# Patient Record
Sex: Male | Born: 1995 | Race: Black or African American | Hispanic: No | Marital: Single | Smoking: Never smoker
Health system: Southern US, Community
[De-identification: ages and names within clinical notes are randomized; demographics above are authoritative.]

## PROBLEM LIST (undated history)

## (undated) DIAGNOSIS — S43006A Unspecified dislocation of unspecified shoulder joint, initial encounter: Secondary | ICD-10-CM

---
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
## 2019-09-14 ENCOUNTER — Other Ambulatory Visit: Payer: Self-pay

## 2019-09-14 ENCOUNTER — Emergency Department (HOSPITAL_COMMUNITY)
Admission: EM | Admit: 2019-09-14 | Discharge: 2019-09-14 | Disposition: A | Discharge status: Home / Self Care | Payer: Self-pay | Attending: Emergency Medicine | Admitting: Emergency Medicine

## 2019-09-14 ENCOUNTER — Encounter (HOSPITAL_COMMUNITY): Payer: Self-pay

## 2019-09-14 ENCOUNTER — Emergency Department (HOSPITAL_COMMUNITY): Payer: Medicaid Other

## 2019-09-14 DIAGNOSIS — M25532 Pain in left wrist: Secondary | ICD-10-CM | POA: Insufficient documentation

## 2019-09-14 DIAGNOSIS — Z79899 Other long term (current) drug therapy: Secondary | ICD-10-CM | POA: Insufficient documentation

## 2019-09-14 MED ORDER — NAPROXEN 500 MG PO TABS: 500.0000 mg | ORAL_TABLET | Freq: Two times a day (BID) | ORAL | 0 refills | Status: AC | PRN

## 2019-09-14 NOTE — ED Provider Notes (Signed)
[facility]-EMERGENCY DEPT Provider Note   CSN: [id number] Arrival date & time: 09/14/19  1154     History   Chief Complaint Chief Complaint  Patient presents with  . Wrist Pain    HPI Jason Fry is a 23 y.o. right hand dominant male presents to emergency department today with chief complaint of left wrist pain x2 weeks.  Patient states he was playing baseball and slid into a base when he injured his left wrist.  He endorses pain on the lateral aspect of left wrist. He describes pain as aching.  Pain does not radiate.  Patient states pain is worse with movement and when doing a push-up. He rates pain 5 out of 10 in severity.  He has been taking ibuprofen with transient symptom relief.  He denies fever, chills, numbness, weakness, wound.  Past Medical History:  Diagnosis Date  . Shoulder separation    Right    There are no active problems to display for this patient.   History reviewed. No pertinent surgical history.      Home Medications    Prior to Admission medications   Medication Sig Start Date End Date Taking? Authorizing Provider  albuterol (PROVENTIL HFA;VENTOLIN HFA) 108 (90 BASE) MCG/ACT inhaler Inhale 2 puffs into the lungs every 6 (six) hours as needed for wheezing or shortness of breath.    [provider]  amphetamine-dextroamphetamine (ADDERALL) 20 MG tablet Take 20 mg by mouth every morning.    [provider]  cetirizine (ZYRTEC) 10 MG tablet Take 10 mg by mouth at bedtime.    [provider]  Menthol, Topical Analgesic, (BIOFREEZE EX) Apply 1 application topically daily.    [provider]  naproxen (NAPROSYN) 500 MG tablet Take 1 tablet (500 mg total) by mouth 2 (two) times daily as needed for up to 14 days. 09/14/19 09/28/19  Destinie Thornsberry, Caroleen Hamman, PA-C    Family History History reviewed. No pertinent family history.  Social History Social History   Tobacco Use  . Smoking status: Never  Smoker  Substance Use Topics  . Alcohol use: Not on file  . Drug use: Not on file     Allergies   Patient has no known allergies.   Review of Systems Review of Systems  Constitutional: Negative for chills and fever.  Musculoskeletal: Positive for arthralgias. Negative for joint swelling.  Skin: Negative for rash and wound.  Allergic/Immunologic: Negative for immunocompromised state.  Neurological: Negative for weakness and numbness.     Physical Exam Updated Vital Signs BP 135/85   Pulse (!) 50   Temp 98.1 F (36.7 C) (Oral)   Resp 16   SpO2 100%   Physical Exam Vitals signs and nursing note reviewed.  Constitutional:      Appearance: He is well-developed. He is not ill-appearing or toxic-appearing.  HENT:     Head: Normocephalic and atraumatic.     Nose: Nose normal.  Eyes:     General: No scleral icterus.       Right eye: No discharge.        Left eye: No discharge.     Conjunctiva/sclera: Conjunctivae normal.  Neck:     Musculoskeletal: Normal range of motion.     Vascular: No JVD.  Cardiovascular:     Rate and Rhythm: Normal rate and regular rhythm.     Pulses: Normal pulses.     Heart sounds: Normal heart sounds.  Pulmonary:     Effort: Pulmonary effort is  normal.     Breath sounds: Normal breath sounds.  Abdominal:     General: There is no distension.  Musculoskeletal: Normal range of motion.     Comments: Left wrist with mild tenderness to palpation of lateral aspect.  No swelling. There is no joint effusion noted. Full ROM without pain.  No erythema or warmth overlaying the joint. There is no anatomic snuff box tenderness. Normal sensation and motor function in the median, ulnar, and radial nerve distributions. 2+ radial pulse.  Strong and equal grip strength in bilateral upper extremities Full range of motion of left elbow and shoulder.    Skin:    General: Skin is warm and dry.  Neurological:     Mental Status: He is oriented to person, place,  and time.     GCS: GCS eye subscore is 4. GCS verbal subscore is 5. GCS motor subscore is 6.     Comments: Fluent speech, no facial droop.  Psychiatric:        Behavior: Behavior normal.      ED Treatments / Results  Labs (all labs ordered are listed, but only abnormal results are displayed) Labs Reviewed - No data to display  EKG None  Radiology Dg Wrist Complete Left  Result Date: 09/14/2019 CLINICAL DATA:  Fall on outstretched hand 2 weeks ago. Medial right wrist pain EXAM: LEFT WRIST - COMPLETE 3+ VIEW COMPARISON:  None. FINDINGS: There is no evidence of fracture or dislocation. There is no evidence of arthropathy or other focal bone abnormality. Soft tissues are unremarkable. IMPRESSION: Negative. Electronically Signed   By: Davina Poke M.D.   On: 09/14/2019 12:53    Procedures Procedures (including critical care time)  Medications Ordered in ED Medications - No data to display   Initial Impression / Assessment and Plan / ED Course  I have reviewed the triage vital signs and the nursing notes.  Pertinent labs & imaging results that were available during my care of the patient were reviewed by me and considered in my medical decision making (see chart for details).  Patient presents to the ED with complaints of pain to the left wrist x 2weeks s/p injury sliding into a base. Exam without obvious deformity, open wounds, or bruising, edema. ROM intact. Mild tenderness to palpation of lateral aspect of left wrist. Radial pulse is 2+ bilaterally. Strong and equal grip strength  NVI distally.  No anatomic snuffbox tenderness.  Xray viewed by me is negative for fracture/dislocation. Therapeutic splint provided. PRICE and naproxen recommended. I discussed results, treatment plan, need for follow-up, and return precautions with the patient. Provided opportunity for questions, patient confirmed understanding and are in agreement with plan.    Portions of this note were generated  with [employer]. Dictation errors may occur despite best attempts at proofreading.  Final Clinical Impressions(s) / ED Diagnoses   Final diagnoses:  Acute pain of left wrist    ED Discharge Orders         Ordered    naproxen (NAPROSYN) 500 MG tablet  2 times daily PRN     09/14/19 1307           Cherre Robins, PA-C 09/14/19 1335    Fredia Sorrow, MD 09/22/19 [phone]

## 2019-09-14 NOTE — ED Triage Notes (Addendum)
Pt states that he plays baseball and injured his left wrist sliding into the base. Pt states it hurts on the outside of his wrist. Pt states that the pushup position is painful. Pt is able to move fingers.  Pt states that this occurred 1.5-2 weeks ago

## 2019-09-14 NOTE — Discharge Instructions (Addendum)
Wrist injuries are frequent in adults and children. A sprain is an injury to the ligaments that hold your bones together. A strain is an injury to muscle or muscle tendons (cord like structure) from stretching or pulling.   Your xray is negative for any broken bones today.  -A prescription for naproxen was sent to your pharmacy.  This is an anti-inflammatory medicine.  Please take as prescribed.  You should take food when taking this medicine as it can cause upset stomach.  Do not take any other anti-inflammatory medicine such as ibuprofen, Aleve, Advil, Motrin, Goody powders while taking naproxen.  Wear the splint for comfort.  If you were given a splint, wear your splint for at least one week or until seen by a physician for a follow-up examination.  -I have given you information for Dr. Tamera Punt, a bone doctor with [facility].  He can call his office to schedule follow-up appointment if needed.  SEEK IMMEDIATE MEDICAL ATTENTION IF: Your fingers are swollen very red, white, and cold or blue.  Your fingers are numb or tingling.  You have increasing pain or difficulty moving your fingers. Remember the importance of follow-up and possible follow-up x-rays. Improvement in pain level is not 100% insurance of not having a fracture.

## 2019-09-14 NOTE — ED Notes (Signed)
ED Provider at bedside. 

## 2020-01-09 ENCOUNTER — Ambulatory Visit: Payer: Medicaid Other

## 2020-03-23 DEATH — deceased

## 2020-09-22 IMAGING — CR DG WRIST COMPLETE 3+V*L*
4 series · 4 of 4 positions shown · non-contrast
Comparison: None.

CLINICAL DATA: Fall on outstretched hand 2 weeks ago. Medial right
wrist pain

EXAM:
LEFT WRIST - COMPLETE 3+ VIEW

[x wrist pa left]
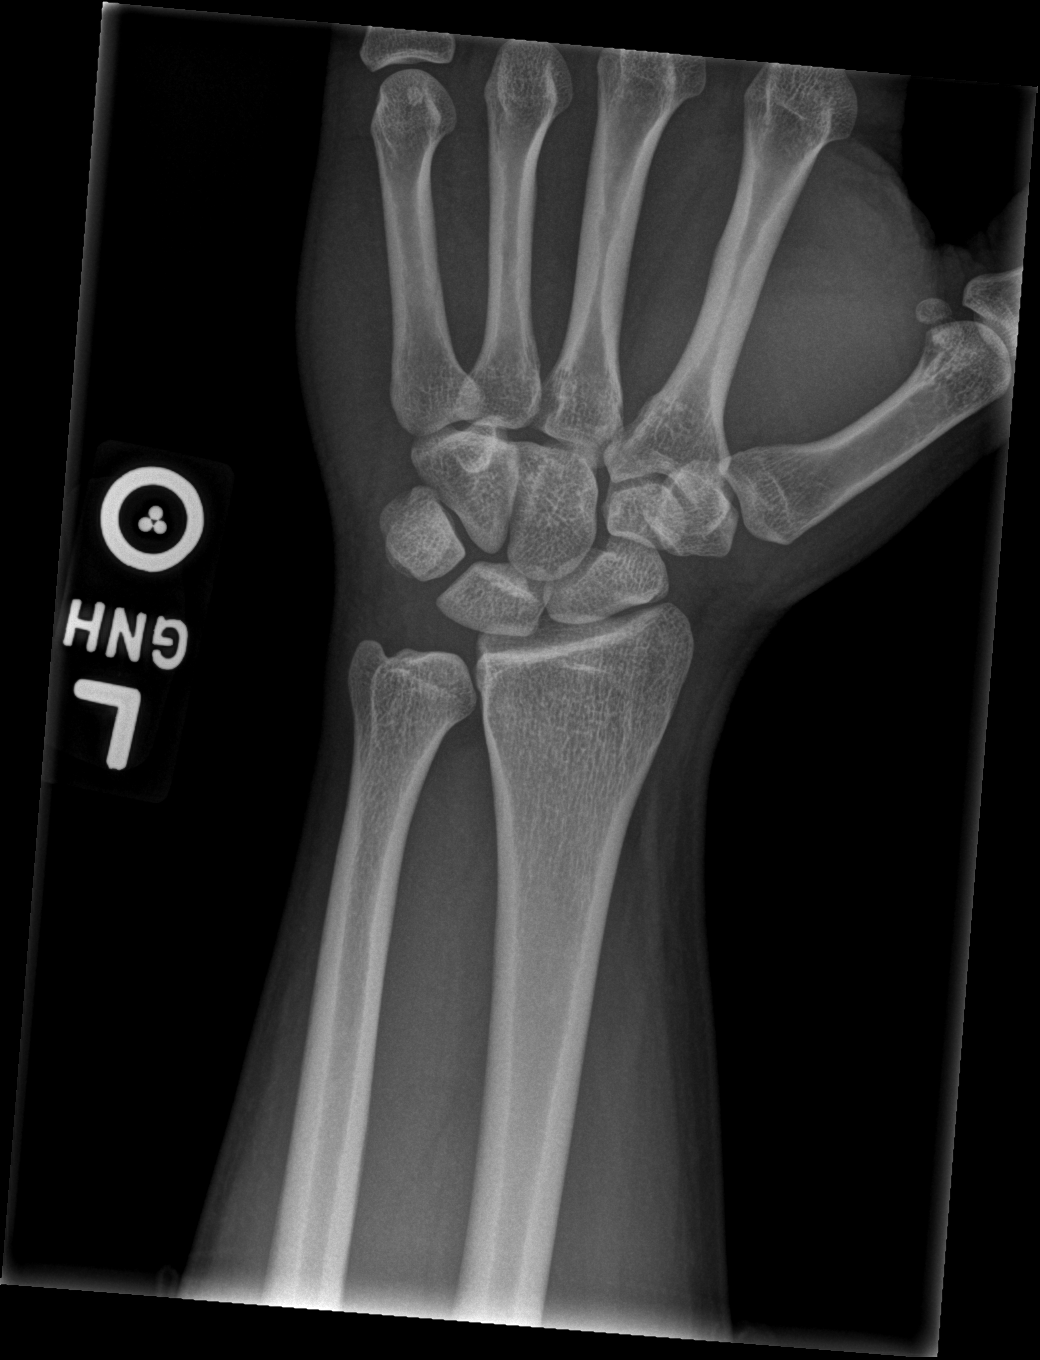

[x wrist obl left]
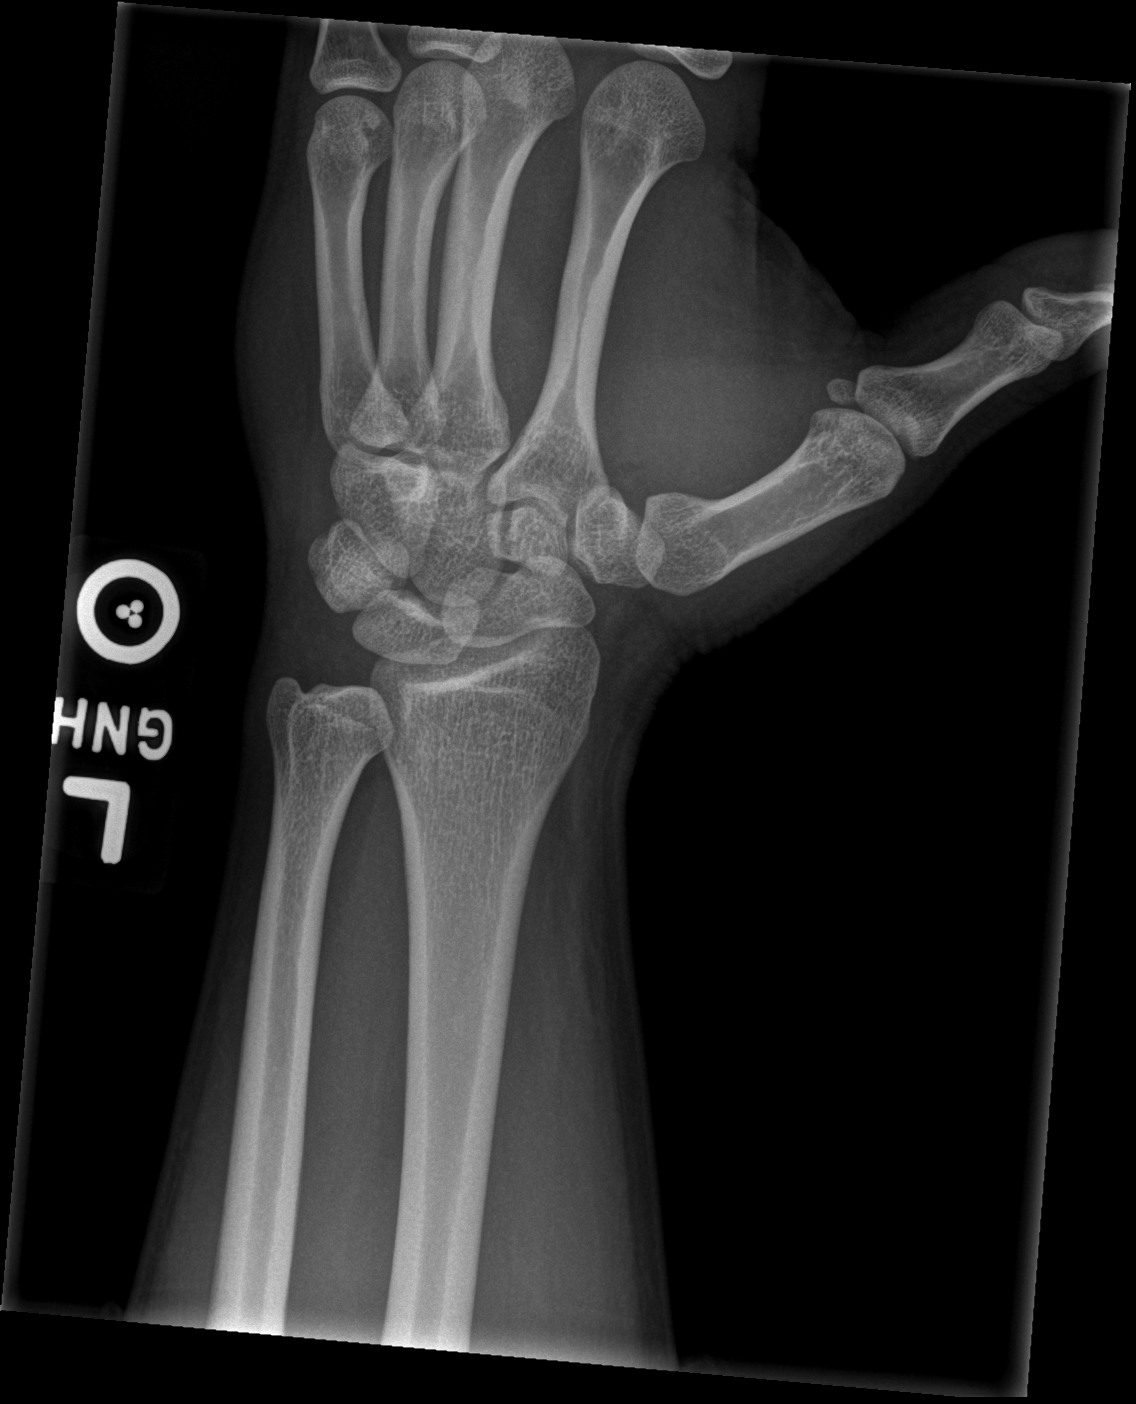

[x wrist lat left]
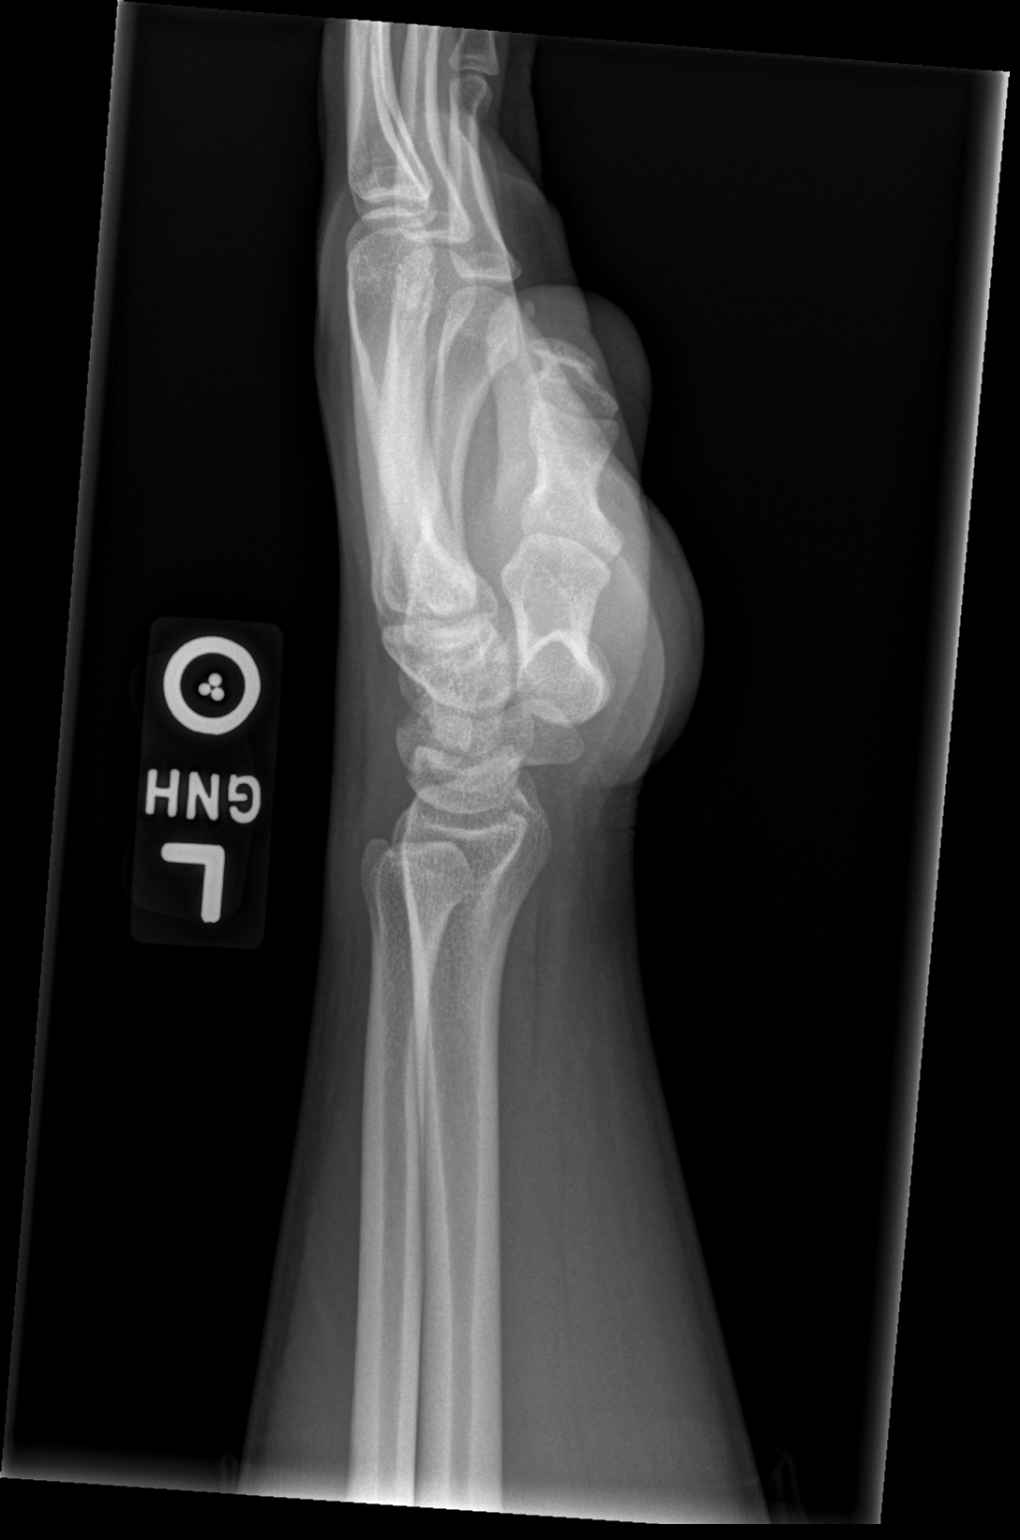

[x wrist navicular view left]
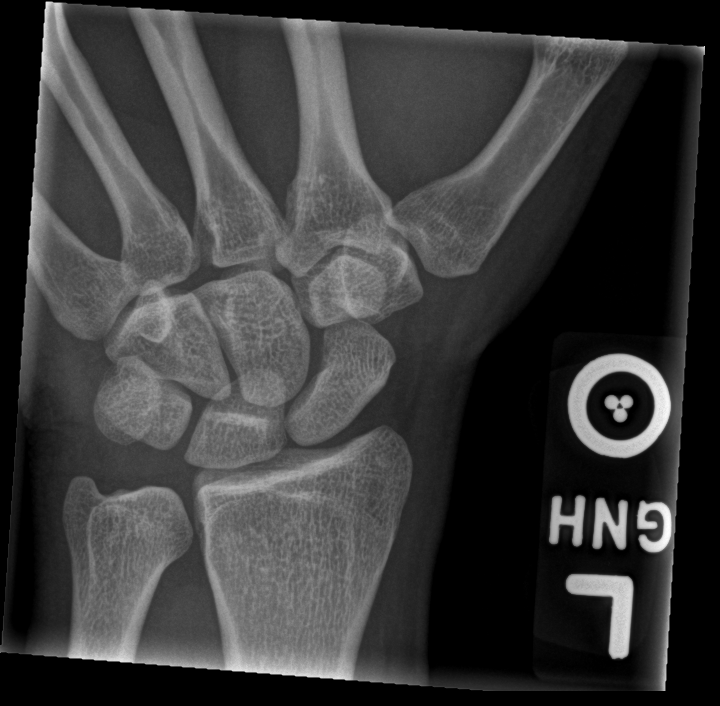

[4 of 4 positions shown; findings below may reference images not displayed]

FINDINGS: There is no evidence of fracture or dislocation. There is no
evidence of arthropathy or other focal bone abnormality. Soft
tissues are unremarkable.
IMPRESSION: Negative.
